# Patient Record
Sex: Male | Born: 1960 | Race: White | Hispanic: No | Marital: Single | Smoking: Never smoker
Health system: Southern US, Community
[De-identification: ages and names within clinical notes are randomized; demographics above are authoritative.]

## PROBLEM LIST (undated history)

## (undated) DIAGNOSIS — Z789 Other specified health status: Secondary | ICD-10-CM

## (undated) HISTORY — PX: LEG SURGERY: SHX1003

---

## 2004-01-25 ENCOUNTER — Emergency Department (HOSPITAL_COMMUNITY): Admission: EM | Admit: 2004-01-25 | Discharge: 2004-01-25 | Payer: Self-pay | Admitting: Emergency Medicine

## 2015-11-21 ENCOUNTER — Emergency Department (HOSPITAL_COMMUNITY)
Admission: EM | Admit: 2015-11-21 | Discharge: 2015-11-21 | Disposition: A | Discharge status: Home / Self Care | Payer: Self-pay | Attending: Emergency Medicine | Admitting: Emergency Medicine

## 2015-11-21 ENCOUNTER — Emergency Department (HOSPITAL_COMMUNITY): Payer: Self-pay

## 2015-11-21 ENCOUNTER — Encounter (HOSPITAL_COMMUNITY): Payer: Self-pay | Admitting: Family Medicine

## 2015-11-21 DIAGNOSIS — W010XXA Fall on same level from slipping, tripping and stumbling without subsequent striking against object, initial encounter: Secondary | ICD-10-CM | POA: Insufficient documentation

## 2015-11-21 DIAGNOSIS — Y999 Unspecified external cause status: Secondary | ICD-10-CM | POA: Insufficient documentation

## 2015-11-21 DIAGNOSIS — S63253A Unspecified dislocation of left middle finger, initial encounter: Secondary | ICD-10-CM | POA: Insufficient documentation

## 2015-11-21 DIAGNOSIS — S63251A Unspecified dislocation of left index finger, initial encounter: Secondary | ICD-10-CM | POA: Insufficient documentation

## 2015-11-21 DIAGNOSIS — S63259A Unspecified dislocation of unspecified finger, initial encounter: Secondary | ICD-10-CM

## 2015-11-21 DIAGNOSIS — Y9389 Activity, other specified: Secondary | ICD-10-CM | POA: Insufficient documentation

## 2015-11-21 DIAGNOSIS — Y929 Unspecified place or not applicable: Secondary | ICD-10-CM | POA: Insufficient documentation

## 2015-11-21 DIAGNOSIS — Z79891 Long term (current) use of opiate analgesic: Secondary | ICD-10-CM | POA: Insufficient documentation

## 2015-11-21 MED ORDER — BUPIVACAINE HCL (PF) 0.5 % IJ SOLN
20.0000 mL | Freq: Once | INTRAMUSCULAR | Status: AC
  Administered 2015-11-21: 20 mL
  Filled 2015-11-21: qty 20

## 2015-11-21 MED ORDER — HYDROCODONE-ACETAMINOPHEN 5-325 MG PO TABS: 1.0000 | ORAL_TABLET | ORAL | Status: DC | PRN

## 2015-11-21 NOTE — ED Notes (Signed)
Pt here for deformity to right middle and index finger. sts he was washing the truck and he put his left hand out. Denies any other injury. sts little pain.

## 2015-11-21 NOTE — ED Provider Notes (Signed)
CSN: [id number]     Arrival date & time 11/21/15  1535 History   First MD Initiated Contact with Patient 11/21/15 1559     Chief Complaint  Patient presents with  . Finger Injury  PT SAID THAT HE FELL PTA.  PT WAS WASHING HIS TRUCK IN FLIP FLOPS AND SLIPPED AND FELL.  HE SUSTAINED INJURIES TO HIS LEFT 3RD AND 2ND FINGERS WITH OBVIOUS DEFORMITIES.  PT SAID THAT HE DID HIT THE LEFT SIDE OF HIS HEAD, BUT NO LOC.  NO BLOOD THINNERS.  HE ALSO HIT HIS LEFT HIP, BUT HE CAN WALK.    (Consider location/radiation/quality/duration/timing/severity/associated sxs/prior Treatment) The history is provided by the patient and the spouse.    History reviewed. No pertinent past medical history. History reviewed. No pertinent past surgical history. History reviewed. No pertinent family history. Social History  Substance Use Topics  . Smoking status: Never Smoker   . Smokeless tobacco: None  . Alcohol Use: None    Review of Systems  Musculoskeletal:       LEFT 2ND AND 3RD FINGER PAIN  All other systems reviewed and are negative.     Allergies  Review of patient's allergies indicates no known allergies.  Home Medications   Prior to Admission medications   Medication Sig Start Date End Date Taking? Authorizing Provider  HYDROcodone-acetaminophen (NORCO/VICODIN) 5-325 MG tablet Take 1 tablet by mouth every 4 (four) hours as needed. 11/21/15   Jacalyn Lefevre, MD   BP 135/86 mmHg  Pulse 90  Temp(Src) 99.1 F (37.3 C) (Oral)  Resp 16  Ht 6" (0.152 m)  Wt 200 lb (90.719 kg)  BMI 3926.55 kg/m2  SpO2 97% Physical Exam  Constitutional: He is oriented to person, place, and time. He appears well-developed and well-nourished.  HENT:  Head: Normocephalic and atraumatic.  Right Ear: External ear normal.  Left Ear: External ear normal.  Nose: Nose normal.  Mouth/Throat: Oropharynx is clear and moist.  Eyes: Conjunctivae and EOM are normal. Pupils are equal, round, and reactive to light.  Neck:  Normal range of motion. Neck supple.  Cardiovascular: Normal rate, regular rhythm, normal heart sounds and intact distal pulses.   Pulmonary/Chest: Effort normal and breath sounds normal.  Abdominal: Soft. Bowel sounds are normal.  Musculoskeletal:  CLOSED DISLOCATION OF LEFT 2ND AND 3RD FINGERS AT PIP.   Neurological: He is alert and oriented to person, place, and time.  Skin: Skin is warm and dry.  Psychiatric: He has a normal mood and affect. His behavior is normal. Judgment and thought content normal.  Nursing note and vitals reviewed.   ED Course  .Nerve Block Date/Time: 11/21/2015 4:34 PM Performed by: Jacalyn Lefevre Authorized by: Jacalyn Lefevre Consent: Verbal consent obtained. Risks and benefits: risks, benefits and alternatives were discussed Consent given by: patient Patient understanding: patient states understanding of the procedure being performed Patient consent: the patient's understanding of the procedure matches consent given Procedure consent: procedure consent matches procedure scheduled Relevant documents: relevant documents present and verified Test results: test results available and properly labeled Site marked: the operative site was marked Required items: required blood products, implants, devices, and special equipment available Patient identity confirmed: verbally with patient Time out: Immediately prior to procedure a "time out" was called to verify the correct patient, procedure, equipment, support staff and site/side marked as required. Indications: pain relief and dislocation Body area: upper extremity Nerve: digital Laterality: left Patient sedated: no Preparation: Patient was prepped and draped in the usual sterile fashion. Patient  position: sitting Needle gauge: 27 G Local anesthetic: bupivacaine 0.5% without epinephrine Anesthetic total: 4 ml Outcome: pain improved Patient tolerance: Patient tolerated the procedure well with no immediate  complications Comments: BOTH THE LEFT 2ND AND 3RD FINGERS WERE DIGITALLY BLOCKED.  Reduction of dislocation Date/Time: 11/21/2015 4:35 PM Performed by: Jacalyn LefevreHAVILAND, Nerine Pulse Authorized by: Jacalyn LefevreHAVILAND, Arlicia Paquette Consent: Verbal consent obtained. Risks and benefits: risks, benefits and alternatives were discussed Consent given by: patient Patient understanding: patient states understanding of the procedure being performed Patient consent: the patient's understanding of the procedure matches consent given Procedure consent: procedure consent matches procedure scheduled Relevant documents: relevant documents present and verified Test results: test results available and properly labeled Site marked: the operative site was marked Required items: required blood products, implants, devices, and special equipment available Patient identity confirmed: verbally with patient Time out: Immediately prior to procedure a "time out" was called to verify the correct patient, procedure, equipment, support staff and site/side marked as required. Preparation: Patient was prepped and draped in the usual sterile fashion. Local anesthesia used: yes Anesthesia: digital block Local anesthetic: bupivacaine 0.5% without epinephrine Anesthetic total: 4 ml Patient sedated: no Patient tolerance: Patient tolerated the procedure well with no immediate complications Comments: LEFT 2ND AND 3RD FINGERS WERE REDUCED AT THE PIP JOINTS   (including critical care time) Labs Review Labs Reviewed - No data to display  Imaging Review Dg Hand Complete Left  11/21/2015  CLINICAL DATA:  Pt c/o generalized left hand pain and swelling after falling off his truck while washing it this afternoon. Pt states his middle and ring fingers were dislocated; images are taken post reduction. EXAM: LEFT HAND - COMPLETE 3+ VIEW COMPARISON:  None. FINDINGS: No fracture or dislocation. Mild scattered degenerative change. Mild chronic appearing deformity  fifth proximal phalanx with possible 4 mm exostosis. IMPRESSION: No acute findings Electronically Signed   By: Esperanza Heiraymond  Rubner M.D.   On: 11/21/2015 16:44  XRAY WAS POST REDUCTION I have personally reviewed and evaluated these images and lab results as part of my medical decision-making.   EKG Interpretation None     MDM  PT WILL BE PLACED IN A SPLINT AND INSTRUCTED TO F/U WITH HAND (DR. Janee MornHOMPSON).  RETURN HERE IF WORSE.  NO FURTHER IMAGING OF HEAD OR HIP NEEDED AS PT IS AMBULATORY AND HAS NO H/A, DIZZINESS, N/V AND IS NOT ON BLOOD THINNERS. Final diagnoses:  Finger dislocation, initial encounter       Jacalyn LefevreJulie Tadan Shill, MD 11/21/15 1656

## 2015-11-21 NOTE — Discharge Instructions (Signed)
Finger Dislocation ° A finger dislocation happens when your finger bones separate from where they connect with each other. It usually happens to the joint closest to your knuckle (proximal interphalangeal joint). °Your doctor will put your bones back in the joint. This may be done by pulling on your finger or through surgery. A bandage (dressing) or splint will be placed around your joint. The bandage or splint holds your finger in place while it heals. °HOME CARE  °· Rest your injured joint. Do not move it until told to do so. °· Put ice on your injured joint as told by your doctor. °¨ Put ice in a plastic bag. °¨ Place a towel between your skin and the bag. °¨ Leave the ice on for 15-20 minutes at a time. Do this every 2 hours while you are awake. °· Raise (elevate) your hand above your heart as told by your doctor. °· Only take medicines as told by your doctor. °GET HELP RIGHT AWAY IF: °· Your bandage or splint becomes damaged. °· Your pain becomes worse, not better. °· You lose feeling in your finger or it becomes cold or white. °MAKE SURE YOU: °· Understand these instructions. °· Will watch your condition. °· Will get help right away if you are not doing well or get worse. °  °This information is not intended to replace advice given to you by your health care provider. Make sure you discuss any questions you have with your health care provider. °  °Document Released: 05/18/2011 Document Revised: 06/19/2014 Document Reviewed: 10/23/2014 °Elsevier Interactive Patient Education ©2016 [employer] ° °

## 2017-06-12 IMAGING — CR DG HAND COMPLETE 3+V*L*
3 series · 3 of 3 positions shown · non-contrast
Comparison: None.

CLINICAL DATA: Pt c/o generalized left hand pain and swelling after
falling off his truck while washing it this afternoon. Pt states his
middle and ring fingers were dislocated; images are taken post
reduction.

EXAM:
LEFT HAND - COMPLETE 3+ VIEW

[hand pa]
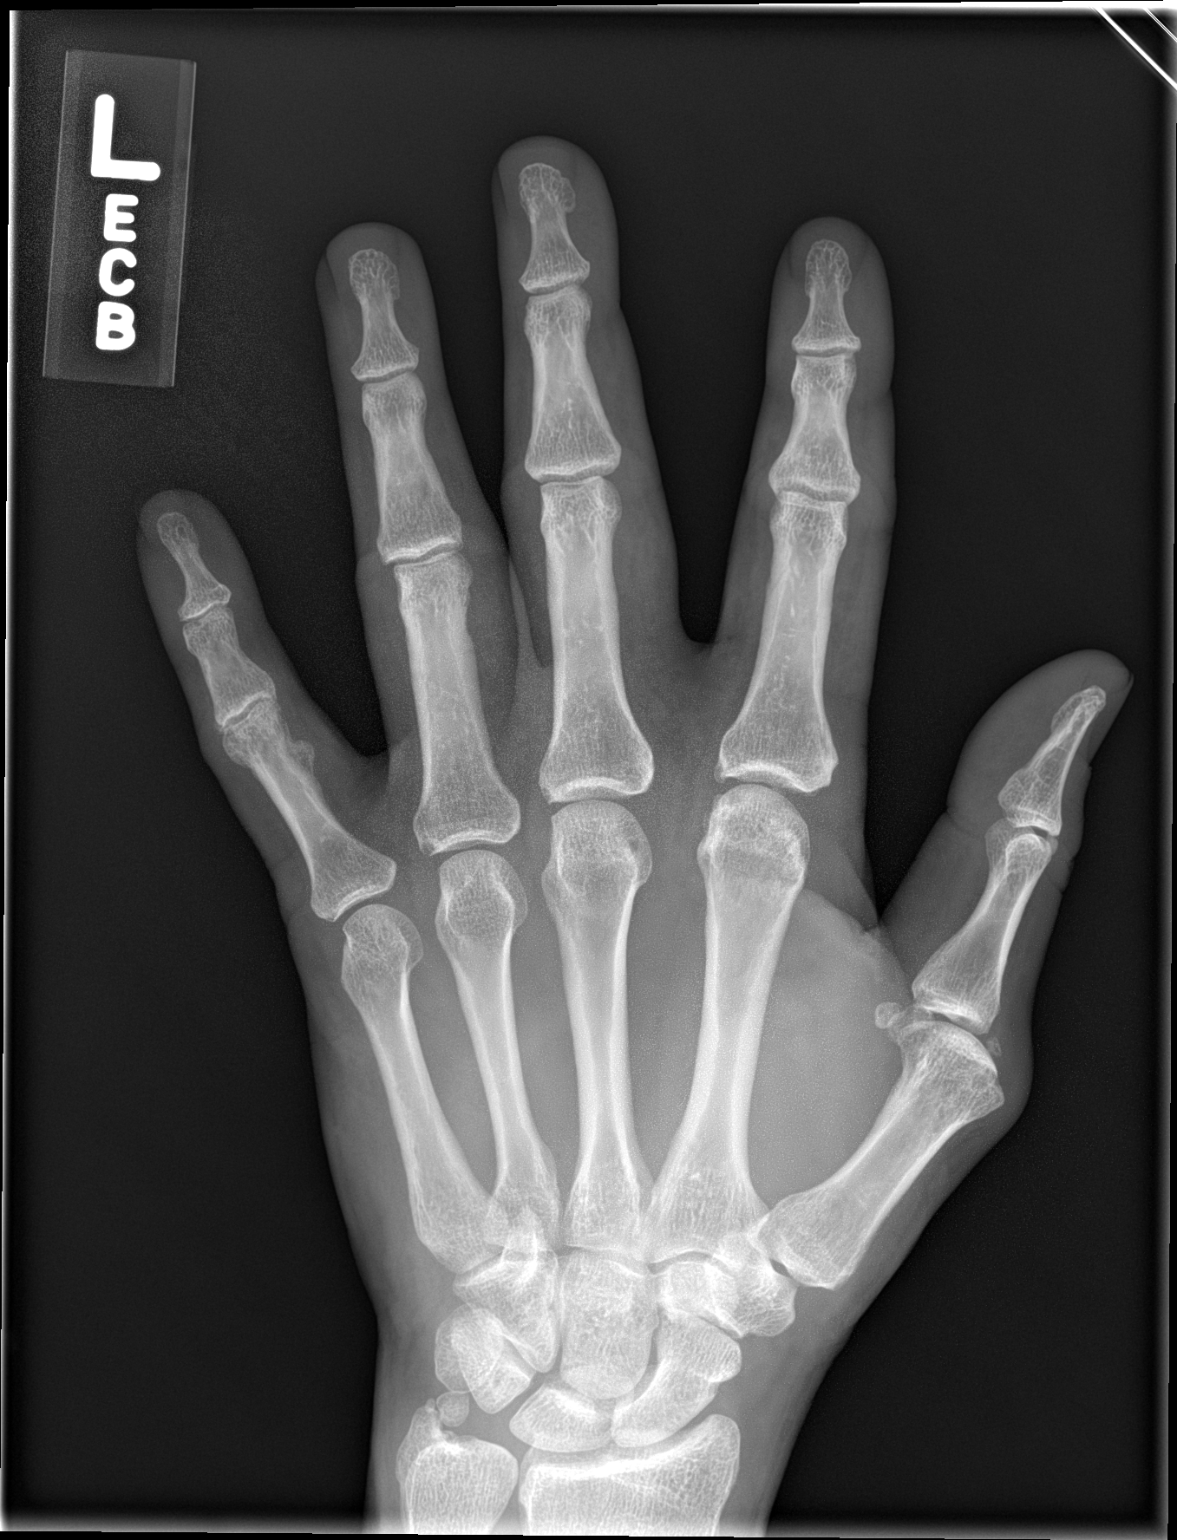

[hand obl]
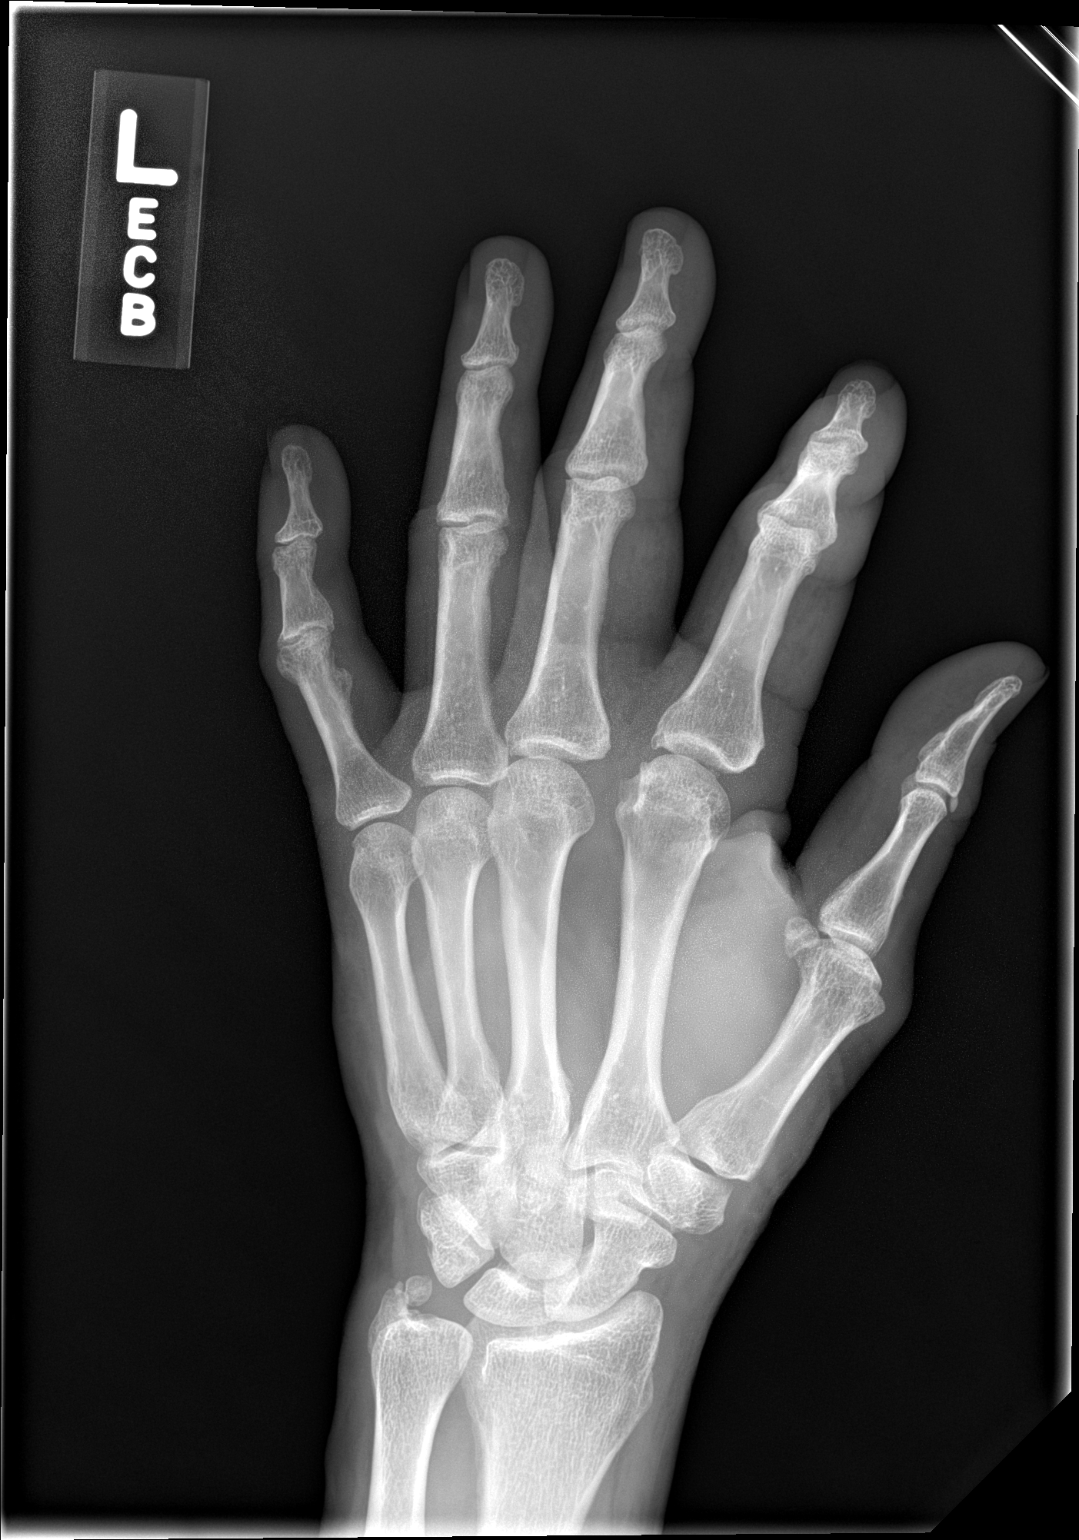

[hand lat]
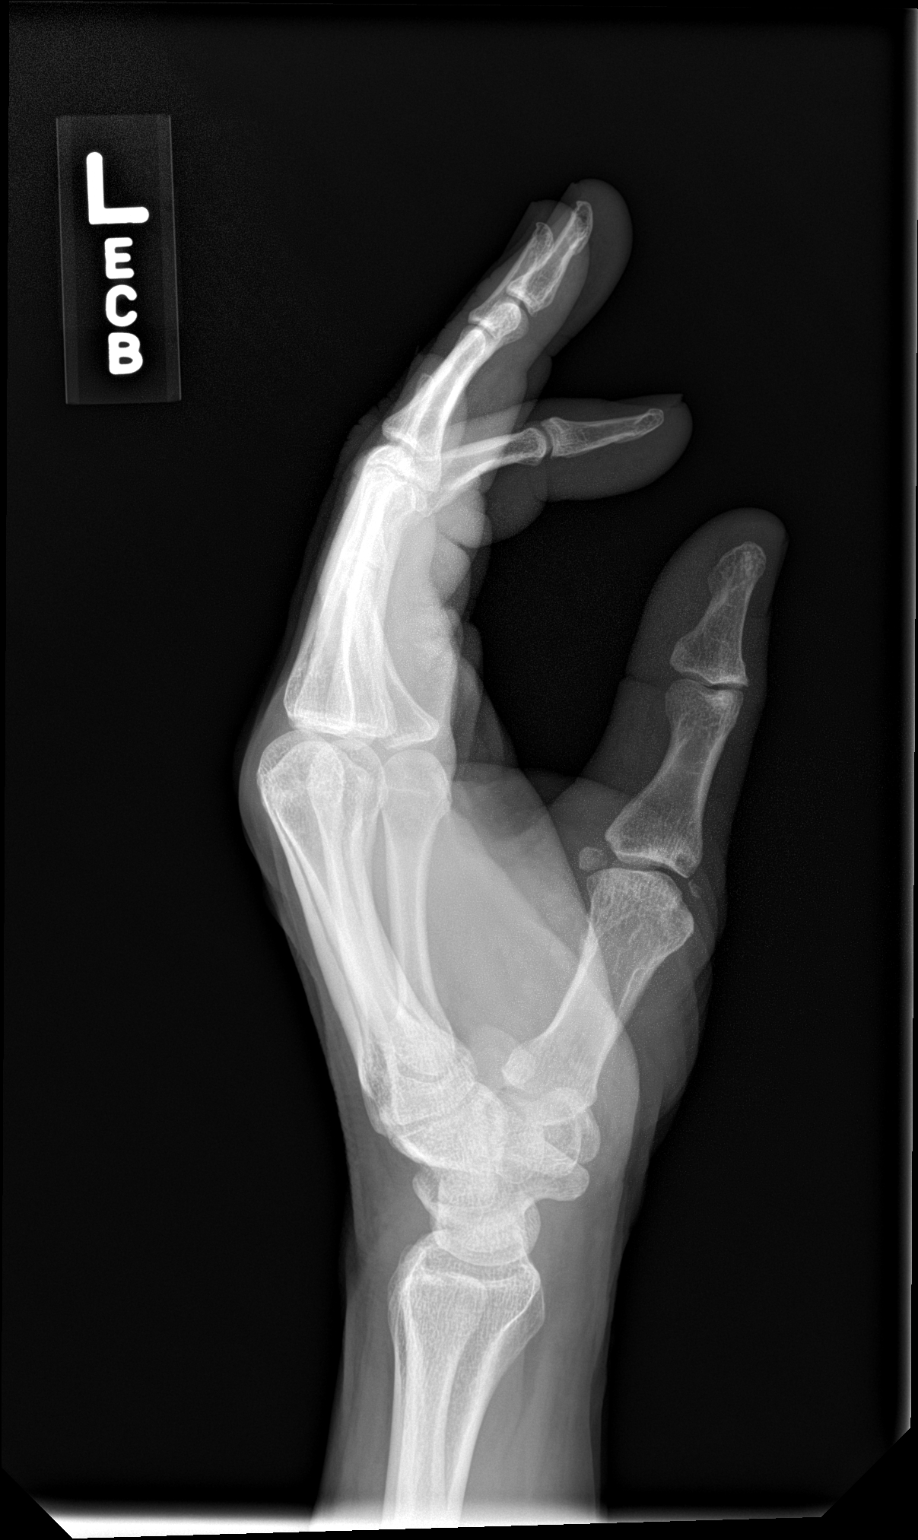

[3 of 3 positions shown; findings below may reference images not displayed]

FINDINGS: No fracture or dislocation. Mild scattered degenerative change. Mild
chronic appearing deformity fifth proximal phalanx with possible 4
mm exostosis.
IMPRESSION: No acute findings

## 2017-10-05 ENCOUNTER — Encounter (HOSPITAL_COMMUNITY): Payer: Self-pay | Admitting: *Deleted

## 2017-10-05 ENCOUNTER — Emergency Department (HOSPITAL_COMMUNITY)
Admission: EM | Admit: 2017-10-05 | Discharge: 2017-10-05 | Disposition: A | Discharge status: Home / Self Care | Payer: Self-pay | Attending: Emergency Medicine | Admitting: Emergency Medicine

## 2017-10-05 ENCOUNTER — Emergency Department (HOSPITAL_COMMUNITY): Payer: Self-pay

## 2017-10-05 DIAGNOSIS — Y93H2 Activity, gardening and landscaping: Secondary | ICD-10-CM | POA: Insufficient documentation

## 2017-10-05 DIAGNOSIS — Y999 Unspecified external cause status: Secondary | ICD-10-CM | POA: Insufficient documentation

## 2017-10-05 DIAGNOSIS — Y929 Unspecified place or not applicable: Secondary | ICD-10-CM | POA: Insufficient documentation

## 2017-10-05 DIAGNOSIS — W1830XA Fall on same level, unspecified, initial encounter: Secondary | ICD-10-CM | POA: Insufficient documentation

## 2017-10-05 DIAGNOSIS — S86811A Strain of other muscle(s) and tendon(s) at lower leg level, right leg, initial encounter: Secondary | ICD-10-CM | POA: Insufficient documentation

## 2017-10-05 MED ORDER — NAPROXEN 500 MG PO TABS: 500.0000 mg | ORAL_TABLET | Freq: Two times a day (BID) | ORAL | 0 refills | Status: DC

## 2017-10-05 NOTE — ED Triage Notes (Signed)
Pt complains of right knee pain since tripping over a concrete block 2 days ago. Pt states he can't bend his knee.

## 2017-10-05 NOTE — ED Provider Notes (Signed)
Patient unable to fully extend right knee.  Complains of right knee pain on exam no distress.  Right lower extremity skin intact.  Not red or warm.  He is swollen and tender over the anterior right knee.  He is unable to fully extend the knee. X-ray viewed by me   Doug SouJacubowitz, Shelley Cocke, MD 10/05/17 2117

## 2017-10-05 NOTE — Discharge Instructions (Addendum)
Call Dr. Woodward KuHaddox's office Monday morning and tell them you were evaluated in the ED and we spoke with him and you need a follow up appointment for a ruptured patellar tendon.

## 2017-10-05 NOTE — ED Provider Notes (Signed)
[facility] Provider Note   CSN: [id number] Arrival date & time: 10/05/17  1645     History   Chief Complaint Chief Complaint  Patient presents with  . Knee Injury    HPI Shawn MorrowBobby D Weinmann is a 57 y.o. male who presents to the ED with right knee pain. Patient reports that he was weed eating and the pain started after tripping over a concrete block. Patient states he fell and twisted his knee. The injury happened 2 days ago. Patient states that he thought it would get better with time and resting the area, however, he reports he is still unable to bend his knee.   HPI  History reviewed. No pertinent past medical history.  There are no active problems to display for this patient.   History reviewed. No pertinent surgical history.      Home Medications    Prior to Admission medications   Medication Sig Start Date End Date Taking? Authorizing Provider  naproxen (NAPROSYN) 500 MG tablet Take 1 tablet (500 mg total) by mouth 2 (two) times daily. 10/05/17   Janne NapoleonNeese, Nykeria Mealing M, NP    Family History No family history on file.  Social History Social History   Tobacco Use  . Smoking status: Never Smoker  Substance Use Topics  . Alcohol use: Not on file  . Drug use: Not on file     Allergies   Patient has no known allergies.   Review of Systems Review of Systems  Musculoskeletal: Positive for arthralgias and joint swelling.       Right knee swelling and decreased range of motion.  All other systems reviewed and are negative.    Physical Exam Updated Vital Signs BP (!) 145/85 (BP Location: Left Arm)   Pulse 85   Temp 98.2 F (36.8 C) (Oral)   Resp 16   SpO2 100%   Physical Exam  Constitutional: He appears well-developed and well-nourished. No distress.  HENT:  Head: Normocephalic.  Eyes: EOM are normal.  Neck: Neck supple.  Cardiovascular: Normal rate.  Pulmonary/Chest: Effort normal.  Musculoskeletal:       Right knee:  He exhibits decreased range of motion, swelling, abnormal alignment and abnormal patellar mobility. Tenderness found. Patellar tendon tenderness noted.  Pedal pulse 2+  Neurological: He is alert.  Skin: Skin is warm and dry.  Psychiatric: He has a normal mood and affect.  Nursing note and vitals reviewed.    ED Treatments / Results  Labs (all labs ordered are listed, but only abnormal results are displayed) Labs Reviewed - No data to display  Radiology Dg Knee Complete 4 Views Right  Result Date: 10/05/2017 CLINICAL DATA:  Fall with severe pain and swelling EXAM: RIGHT KNEE - COMPLETE 4+ VIEW COMPARISON:  None. FINDINGS: No acute displaced fracture is seen. Mild degenerative changes of the medial compartment. Patella Alta with significant soft tissue swelling of the infrapatellar soft tissues. IMPRESSION: 1. No acute fracture seen 2. Patella Alta with significant soft tissue thickening in the region of the infrapatellar soft tissues; if patellar tendon rupture or injury is suspected clinically, further evaluation with MRI could be obtained. Electronically Signed   By: Jasmine PangKim  Fujinaga M.D.   On: 10/05/2017 17:52   Dr. Ethelda ChickJacubowitz in to examine the patient and agrees with a/p.  Consult with Dr. Jena GaussHaddix and he will see the patient for f/u in the off and discuss surgery.   Procedures Procedures (including critical care time)  Medications Ordered in ED  Medications - No data to display   Initial Impression / Assessment and Plan / ED Course  I have reviewed the triage vital signs and the nursing notes. 57 y.o. male with limited range of motion of the right knee s/p injury stable for d/c to follow up with orthopedic surgeon. Most likely patellar tendon rupture. Knee immobilizer applied, crutches, ice, elevation, NSAIDS. No signs of compartment syndrome at this time. Discussed with the patient clinical and x-ray findings and consult with ortho. Patient agrees with plan. Patient to return for any  problems.   Final Clinical Impressions(s) / ED Diagnoses   Final diagnoses:  Rupture of right patellar tendon, initial encounter    ED Discharge Orders        Ordered    naproxen (NAPROSYN) 500 MG tablet  2 times daily     10/05/17 2108       Kerrie [place], [place] 10/05/17 2147    Doug Sou, MD 10/06/17 [phone]

## 2017-10-09 ENCOUNTER — Ambulatory Visit: Payer: Self-pay | Admitting: Student

## 2017-10-09 DIAGNOSIS — S86811A Strain of other muscle(s) and tendon(s) at lower leg level, right leg, initial encounter: Secondary | ICD-10-CM

## 2017-10-10 ENCOUNTER — Encounter (HOSPITAL_COMMUNITY): Payer: Self-pay | Admitting: *Deleted

## 2017-10-10 ENCOUNTER — Other Ambulatory Visit: Payer: Self-pay

## 2017-10-10 NOTE — Progress Notes (Signed)
Spoke with pt for pre-op call. Pt denies cardiac history, HTN or diabetes.  

## 2017-10-12 ENCOUNTER — Ambulatory Visit (HOSPITAL_COMMUNITY): Payer: Self-pay | Admitting: Certified Registered"

## 2017-10-12 ENCOUNTER — Encounter (HOSPITAL_COMMUNITY)
Admission: RE | Disposition: A | Discharge status: Home / Self Care | Payer: Self-pay | Source: Ambulatory Visit | Attending: Student

## 2017-10-12 ENCOUNTER — Ambulatory Visit (HOSPITAL_COMMUNITY)
Admission: RE | Admit: 2017-10-12 | Discharge: 2017-10-12 | Disposition: A | Discharge status: Home / Self Care | Payer: Self-pay | Source: Ambulatory Visit | Attending: Student | Admitting: Student

## 2017-10-12 ENCOUNTER — Encounter (HOSPITAL_COMMUNITY): Payer: Self-pay | Admitting: Certified Registered"

## 2017-10-12 DIAGNOSIS — Y9389 Activity, other specified: Secondary | ICD-10-CM | POA: Insufficient documentation

## 2017-10-12 DIAGNOSIS — W010XXA Fall on same level from slipping, tripping and stumbling without subsequent striking against object, initial encounter: Secondary | ICD-10-CM | POA: Insufficient documentation

## 2017-10-12 DIAGNOSIS — Z8249 Family history of ischemic heart disease and other diseases of the circulatory system: Secondary | ICD-10-CM | POA: Insufficient documentation

## 2017-10-12 DIAGNOSIS — S86811A Strain of other muscle(s) and tendon(s) at lower leg level, right leg, initial encounter: Secondary | ICD-10-CM | POA: Insufficient documentation

## 2017-10-12 DIAGNOSIS — Z79899 Other long term (current) drug therapy: Secondary | ICD-10-CM | POA: Insufficient documentation

## 2017-10-12 DIAGNOSIS — Z880 Allergy status to penicillin: Secondary | ICD-10-CM | POA: Insufficient documentation

## 2017-10-12 DIAGNOSIS — S76111A Strain of right quadriceps muscle, fascia and tendon, initial encounter: Secondary | ICD-10-CM | POA: Insufficient documentation

## 2017-10-12 DIAGNOSIS — Y92096 Garden or yard of other non-institutional residence as the place of occurrence of the external cause: Secondary | ICD-10-CM | POA: Insufficient documentation

## 2017-10-12 HISTORY — DX: Other specified health status: Z78.9

## 2017-10-12 HISTORY — PX: PATELLAR TENDON REPAIR: SHX737

## 2017-10-12 LAB — CBC
HEMATOCRIT: 40.2 % (ref 39.0–52.0)
Hemoglobin: 13.9 g/dL (ref 13.0–17.0)
MCH: 32.1 pg (ref 26.0–34.0)
MCHC: 34.6 g/dL (ref 30.0–36.0)
MCV: 92.8 fL (ref 78.0–100.0)
Platelets: 171 10*3/uL (ref 150–400)
RBC: 4.33 MIL/uL (ref 4.22–5.81)
RDW: 12.2 % (ref 11.5–15.5)
WBC: 4.4 10*3/uL (ref 4.0–10.5)

## 2017-10-12 SURGERY — REPAIR, TENDON, PATELLAR
Anesthesia: General | Laterality: Right

## 2017-10-12 MED ORDER — LACTATED RINGERS IV SOLN
Freq: Once | INTRAVENOUS | Status: AC
  Administered 2017-10-12: 07:00:00 via INTRAVENOUS

## 2017-10-12 MED ORDER — HYDROMORPHONE HCL 2 MG/ML IJ SOLN
INTRAMUSCULAR | Status: AC
  Administered 2017-10-12: 0.5 mg via INTRAVENOUS
  Filled 2017-10-12: qty 1

## 2017-10-12 MED ORDER — PROPOFOL 10 MG/ML IV BOLUS
INTRAVENOUS | Status: DC | PRN
  Administered 2017-10-12: 100 mg via INTRAVENOUS
  Administered 2017-10-12: 200 mg via INTRAVENOUS

## 2017-10-12 MED ORDER — ASPIRIN EC 325 MG PO TBEC: 325.0000 mg | DELAYED_RELEASE_TABLET | Freq: Every day | ORAL | 0 refills | Status: AC

## 2017-10-12 MED ORDER — PROPOFOL 10 MG/ML IV BOLUS
INTRAVENOUS | Status: AC
  Filled 2017-10-12: qty 20

## 2017-10-12 MED ORDER — DEXAMETHASONE SODIUM PHOSPHATE 10 MG/ML IJ SOLN
INTRAMUSCULAR | Status: DC | PRN
  Administered 2017-10-12: 10 mg via INTRAVENOUS

## 2017-10-12 MED ORDER — PROPOFOL 10 MG/ML IV BOLUS
INTRAVENOUS | Status: AC
Start: 2017-10-12 — End: ?
  Filled 2017-10-12: qty 20

## 2017-10-12 MED ORDER — HYDROCODONE-ACETAMINOPHEN 5-325 MG PO TABS: 1.0000 | ORAL_TABLET | Freq: Four times a day (QID) | ORAL | 0 refills | Status: AC | PRN

## 2017-10-12 MED ORDER — MIDAZOLAM HCL 5 MG/5ML IJ SOLN
INTRAMUSCULAR | Status: DC | PRN
  Administered 2017-10-12: 2 mg via INTRAVENOUS

## 2017-10-12 MED ORDER — LIDOCAINE 2% (20 MG/ML) 5 ML SYRINGE
INTRAMUSCULAR | Status: DC | PRN
  Administered 2017-10-12: 100 mg via INTRAVENOUS

## 2017-10-12 MED ORDER — PROMETHAZINE HCL 25 MG/ML IJ SOLN
6.2500 mg | INTRAMUSCULAR | Status: DC | PRN
Start: 2017-10-12 — End: 2017-10-12

## 2017-10-12 MED ORDER — KETOROLAC TROMETHAMINE 30 MG/ML IJ SOLN
INTRAMUSCULAR | Status: AC
  Filled 2017-10-12: qty 1

## 2017-10-12 MED ORDER — VANCOMYCIN HCL 1000 MG IV SOLR
INTRAVENOUS | Status: AC
  Filled 2017-10-12: qty 1000

## 2017-10-12 MED ORDER — BUPIVACAINE HCL (PF) 0.25 % IJ SOLN
INTRAMUSCULAR | Status: DC | PRN
  Administered 2017-10-12: 20 mL

## 2017-10-12 MED ORDER — VANCOMYCIN HCL 1000 MG IV SOLR
INTRAVENOUS | Status: DC | PRN
  Administered 2017-10-12: 1000 mg via TOPICAL

## 2017-10-12 MED ORDER — LACTATED RINGERS IV SOLN
INTRAVENOUS | Status: DC | PRN
  Administered 2017-10-12: 08:00:00 via INTRAVENOUS

## 2017-10-12 MED ORDER — KETOROLAC TROMETHAMINE 30 MG/ML IJ SOLN
30.0000 mg | Freq: Once | INTRAMUSCULAR | Status: AC | PRN
  Administered 2017-10-12: 30 mg via INTRAVENOUS

## 2017-10-12 MED ORDER — MIDAZOLAM HCL 2 MG/2ML IJ SOLN
INTRAMUSCULAR | Status: AC
  Filled 2017-10-12: qty 2

## 2017-10-12 MED ORDER — BUPIVACAINE HCL (PF) 0.5 % IJ SOLN
INTRAMUSCULAR | Status: AC
  Filled 2017-10-12: qty 30

## 2017-10-12 MED ORDER — CEFAZOLIN SODIUM-DEXTROSE 2-4 GM/100ML-% IV SOLN
2.0000 g | INTRAVENOUS | Status: AC
  Administered 2017-10-12: 2 g via INTRAVENOUS

## 2017-10-12 MED ORDER — 0.9 % SODIUM CHLORIDE (POUR BTL) OPTIME
TOPICAL | Status: DC | PRN
  Administered 2017-10-12: 1000 mL

## 2017-10-12 MED ORDER — LIDOCAINE 2% (20 MG/ML) 5 ML SYRINGE
INTRAMUSCULAR | Status: AC
  Filled 2017-10-12: qty 5

## 2017-10-12 MED ORDER — ONDANSETRON HCL 4 MG/2ML IJ SOLN
INTRAMUSCULAR | Status: DC | PRN
  Administered 2017-10-12: 4 mg via INTRAVENOUS

## 2017-10-12 MED ORDER — CHLORHEXIDINE GLUCONATE 4 % EX LIQD: 60.0000 mL | Freq: Once | CUTANEOUS | Status: DC

## 2017-10-12 MED ORDER — HYDROMORPHONE HCL 2 MG/ML IJ SOLN
0.2500 mg | INTRAMUSCULAR | Status: DC | PRN
  Administered 2017-10-12 (×2): 0.5 mg via INTRAVENOUS

## 2017-10-12 MED ORDER — FENTANYL CITRATE (PF) 250 MCG/5ML IJ SOLN
INTRAMUSCULAR | Status: AC
  Filled 2017-10-12: qty 5

## 2017-10-12 MED ORDER — FENTANYL CITRATE (PF) 100 MCG/2ML IJ SOLN
INTRAMUSCULAR | Status: DC | PRN
  Administered 2017-10-12 (×2): 25 ug via INTRAVENOUS
  Administered 2017-10-12: 50 ug via INTRAVENOUS

## 2017-10-12 SURGICAL SUPPLY — 64 items
ADH SKN CLS APL DERMABOND .7 (GAUZE/BANDAGES/DRESSINGS) ×1
BANDAGE ACE 4X5 VEL STRL LF (GAUZE/BANDAGES/DRESSINGS) ×3 IMPLANT
BANDAGE ACE 6X5 VEL STRL LF (GAUZE/BANDAGES/DRESSINGS) ×3 IMPLANT
BIT DRILL 2.4 (BIT) ×1
BIT DRILL QC 2.4 MINI 80 (BIT) IMPLANT
BLADE CLIPPER SURG (BLADE) ×3 IMPLANT
BLADE SURG 10 STRL SS (BLADE) IMPLANT
BNDG GAUZE ELAST 4 BULKY (GAUZE/BANDAGES/DRESSINGS) ×6 IMPLANT
BRUSH SCRUB SURG 4.25 DISP (MISCELLANEOUS) ×6 IMPLANT
COVER SURGICAL LIGHT HANDLE (MISCELLANEOUS) ×6 IMPLANT
CUFF TOURNIQUET SINGLE 34IN LL (TOURNIQUET CUFF) IMPLANT
CUFF TOURNIQUET SINGLE 44IN (TOURNIQUET CUFF) IMPLANT
DECANTER SPIKE VIAL GLASS SM (MISCELLANEOUS) IMPLANT
DERMABOND ADVANCED (GAUZE/BANDAGES/DRESSINGS) ×2
DERMABOND ADVANCED .7 DNX12 (GAUZE/BANDAGES/DRESSINGS) IMPLANT
DRAPE C-ARM 42X72 X-RAY (DRAPES) ×3 IMPLANT
DRAPE C-ARMOR (DRAPES) ×3 IMPLANT
DRILL BIT 2.4MM (BIT) ×3
DRSG ADAPTIC 3X8 NADH LF (GAUZE/BANDAGES/DRESSINGS) ×3 IMPLANT
DRSG EMULSION OIL 3X3 NADH (GAUZE/BANDAGES/DRESSINGS) ×3 IMPLANT
DRSG PAD ABDOMINAL 8X10 ST (GAUZE/BANDAGES/DRESSINGS) ×3 IMPLANT
ELECT REM PT RETURN 9FT ADLT (ELECTROSURGICAL) ×3
ELECTRODE REM PT RTRN 9FT ADLT (ELECTROSURGICAL) ×1 IMPLANT
GAUZE SPONGE 4X4 12PLY STRL (GAUZE/BANDAGES/DRESSINGS) ×2 IMPLANT
GLOVE BIO SURGEON STRL SZ7.5 (GLOVE) ×3 IMPLANT
GLOVE BIO SURGEON STRL SZ8 (GLOVE) ×3 IMPLANT
GLOVE BIOGEL PI IND STRL 7.5 (GLOVE) ×1 IMPLANT
GLOVE BIOGEL PI IND STRL 8 (GLOVE) ×1 IMPLANT
GLOVE BIOGEL PI INDICATOR 7.5 (GLOVE) ×2
GLOVE BIOGEL PI INDICATOR 8 (GLOVE) ×2
GOWN STRL REUS W/ TWL XL LVL3 (GOWN DISPOSABLE) ×1 IMPLANT
GOWN STRL REUS W/TWL 2XL LVL3 (GOWN DISPOSABLE) ×6 IMPLANT
GOWN STRL REUS W/TWL XL LVL3 (GOWN DISPOSABLE) ×3
KIT BASIN OR (CUSTOM PROCEDURE TRAY) ×3 IMPLANT
KIT TURNOVER KIT B (KITS) ×3 IMPLANT
MANIFOLD NEPTUNE II (INSTRUMENTS) ×3 IMPLANT
NEEDLE 22X1 1/2 (OR ONLY) (NEEDLE) IMPLANT
NS IRRIG 1000ML POUR BTL (IV SOLUTION) ×3 IMPLANT
PACK ORTHO EXTREMITY (CUSTOM PROCEDURE TRAY) ×3 IMPLANT
PAD ARMBOARD 7.5X6 YLW CONV (MISCELLANEOUS) ×6 IMPLANT
PAD CAST 4YDX4 CTTN HI CHSV (CAST SUPPLIES) ×2 IMPLANT
PADDING CAST COTTON 4X4 STRL (CAST SUPPLIES) ×6
PADDING CAST COTTON 6X4 STRL (CAST SUPPLIES) ×2 IMPLANT
PASSER SUT SWANSON 36MM LOOP (INSTRUMENTS) IMPLANT
STAPLER VISISTAT 35W (STAPLE) ×3 IMPLANT
SUCTION FRAZIER HANDLE 10FR (MISCELLANEOUS) ×2
SUCTION TUBE FRAZIER 10FR DISP (MISCELLANEOUS) ×1 IMPLANT
SUT FIBERWIRE #2 38 T-5 BLUE (SUTURE)
SUT FIBERWIRE #5 38 CONV NDL (SUTURE)
SUT STEEL 5 V 56 M (SUTURE) ×3 IMPLANT
SUT VIC AB 0 CT1 27 (SUTURE) ×3
SUT VIC AB 0 CT1 27XBRD ANBCTR (SUTURE) ×1 IMPLANT
SUT VIC AB 2-0 CT1 27 (SUTURE) ×6
SUT VIC AB 2-0 CT1 TAPERPNT 27 (SUTURE) ×2 IMPLANT
SUTURE FIBERWR #2 38 T-5 BLUE (SUTURE) IMPLANT
SUTURE FIBERWR #5 38 CONV NDL (SUTURE) IMPLANT
SYR CONTROL 10ML LL (SYRINGE) IMPLANT
TOWEL OR 17X24 6PK STRL BLUE (TOWEL DISPOSABLE) ×3 IMPLANT
TOWEL OR 17X26 10 PK STRL BLUE (TOWEL DISPOSABLE) ×6 IMPLANT
TUBE CONNECTING 12'X1/4 (SUCTIONS) ×1
TUBE CONNECTING 12X1/4 (SUCTIONS) ×2 IMPLANT
UNDERPAD 30X30 (UNDERPADS AND DIAPERS) ×3 IMPLANT
WATER STERILE IRR 1000ML POUR (IV SOLUTION) ×3 IMPLANT
YANKAUER SUCT BULB TIP NO VENT (SUCTIONS) IMPLANT

## 2017-10-12 NOTE — Anesthesia Preprocedure Evaluation (Signed)
Anesthesia Evaluation  Patient identified by MRN, date of birth, ID band Patient awake    Reviewed: Allergy & Precautions, NPO status , Patient's Chart, lab work & pertinent test results  Airway Mallampati: II  TM Distance: >3 FB Neck ROM: Full    Dental no notable dental hx.    Pulmonary neg pulmonary ROS,    Pulmonary exam normal breath sounds clear to auscultation       Cardiovascular negative cardio ROS Normal cardiovascular exam Rhythm:Regular Rate:Normal     Neuro/Psych negative neurological ROS  negative psych ROS   GI/Hepatic negative GI ROS, Neg liver ROS,   Endo/Other  negative endocrine ROS  Renal/GU negative Renal ROS  negative genitourinary   Musculoskeletal negative musculoskeletal ROS (+)   Abdominal   Peds negative pediatric ROS (+)  Hematology negative hematology ROS (+)   Anesthesia Other Findings   Reproductive/Obstetrics negative OB ROS                             Anesthesia Physical Anesthesia Plan  ASA: I  Anesthesia Plan: General   Post-op Pain Management:    Induction: Intravenous  PONV Risk Score and Plan: 2 and Ondansetron, Dexamethasone and Treatment may vary due to age or medical condition  Airway Management Planned: LMA  Additional Equipment:   Intra-op Plan:   Post-operative Plan: Extubation in OR  Informed Consent: I have reviewed the patients History and Physical, chart, labs and discussed the procedure including the risks, benefits and alternatives for the proposed anesthesia with the patient or authorized representative who has indicated his/her understanding and acceptance.     Dental advisory given  Plan Discussed with: CRNA and Surgeon  Anesthesia Plan Comments:         Anesthesia Quick Evaluation  

## 2017-10-12 NOTE — Op Note (Signed)
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 He was found to have patellar tendon rupture. Due to his activity level and age, I felt that repair was indicated. I discussed risks and benefits with the patient. Risks discussed included bleeding requiring blood transfusion, bleeding causing a hematoma, infection, malunion, nonunion, damage to surrounding nerves and blood vessels, pain, hardware prominence or irritation, hardware failure, stiffness, post-traumatic arthritis, DVT/PE, compartment syndrome, and even death. The patient agreed to surgery and consent was obtained.  Operative Findings: Rupture of patellar tendon off of inferior border of patella treated with repair using #5 Fiberwire through drill holes in patella and retinacular repair using #1 vicryl. Knee with greater than 50 degrees of flexion without gapping at repair site.  Procedure: The patient was identified in the preoperative holding area. Consent was confirmed with the patient and their family and all questions were answered. The operative extremity was marked after confirmation with the patient. he was then brought back to the operating room by our anesthesia colleagues. He was carefully transferred over to regular OR table and he was placed under general anesthesia. The operative extremity was then prepped and draped in usual sterile fashion. A preoperative  timeout was performed to verify the patient, the procedure, and the extremity. Preoperative antibiotics were dosed.  An esmarch was used to exsanguinate the right leg and the tourniquet was inflated to . An anterior approach to the knee was made over the patella tendon rupture. The skin and subcutaneous tissue was incised with a knife and skin flaps were mobilized to fully visualize the retinacular tear both medially and laterally. The inferior pole of the patella was debrided with a ronguer to get back to bleeding, healthy cancellous bone for a better repair. The distal aspect of the patellar tendon was debrided to a level of healthy tendon. Two number 5 fiberwire sutures was used to throw Krackow suture through the patellar tendon. Leaving four strands. Three drill holes were made from proximal to distal to pass the strands through. The middle strands were passed beneath the quad tendon to tie to the medial and lateral strands. Kochers were used to provide tension to the patella tendon during tying of the suture strands.   A #1 vicryl suture was used to close the medial and lateral retinacular tear and reinforce the patellar tendon repair. The knee was then flexed and the repair remained strong and without significant tension to almost 60 degrees of flexion. The incision was irrigated and closed with 2-0 vicryl, 3-0 monocryl and dermabond. The tourniquet was deflated. The incision dressed with 4x4s and an ACE wrap and he was placed back in his knee immobilizer. The patient was then taken to the PACU in stable condition.  Post Op Plan/Instructions: Weight bearing as tolerated in knee immobilizer/hinged knee brace locked in extension. Return in 2 weeks for repeat x-rays. Aspirin  daily for VTE prophylaxis.  I was present and performed the entire surgery.  Truitt Merle, MD [facility]

## 2017-10-12 NOTE — Transfer of Care (Signed)
Immediate Anesthesia Transfer of Care Note  Patient: Shawn Thornton  Procedure(s) Performed: PATELLA TENDON REPAIR (Right )  Patient Location: PACU  Anesthesia Type:General  Level of Consciousness: awake, alert  and oriented  Airway & Oxygen Therapy: Patient Spontanous Breathing and Patient connected to nasal cannula oxygen  Post-op Assessment: Report given to RN and Post -op Vital signs reviewed and stable  Post vital signs: Reviewed and stable  Last Vitals:  Vitals Value Taken Time  BP    Temp    Pulse    Resp    SpO2      Last Pain:  Vitals:   10/12/17 0656  TempSrc:   PainSc: 0-No pain      Patients Stated Pain Goal: 3 (10/12/17 0656)  Complications: No apparent anesthesia complications

## 2017-10-12 NOTE — Anesthesia Procedure Notes (Addendum)
Procedure Name: LMA Insertion Date/Time: 10/12/2017 8:06 AM Performed by: Elliot Dally, CRNA Pre-anesthesia Checklist: Patient identified, Emergency Drugs available, Suction available and Patient being monitored Patient Re-evaluated:Patient Re-evaluated prior to induction Oxygen Delivery Method: Circle System Utilized Preoxygenation: Pre-oxygenation with 100% oxygen Induction Type: IV induction Ventilation: Mask ventilation without difficulty LMA: LMA inserted LMA Size: 5.0 Number of attempts: 1 Airway Equipment and Method: Bite block Placement Confirmation: positive ETCO2 Tube secured with: Tape Dental Injury: Teeth and Oropharynx as per pre-operative assessment

## 2017-10-12 NOTE — H&P (Signed)
[facility] ([facility]) H&P  Patient ID: KRISTOPH SATTLER MRN: [id number] DOB/AGE: May 03, 1961 57 y.o.  Reason for Surgery: Right patellar tendon rupture  HPI: GLENN GULLICKSON is an 57 y.o. male who was doing yardwork when he tripped over concrete and fell and twisted his knee. Patient was unable to bend or straighten his knee. He presented to ER where x-rays showed patella alta and he was diagnosed with patellar tendon rupture. He was placed in knee immobilizer and was told to follow up with me. Denies any significant medical problems.  Past Medical History:  Diagnosis Date  . Medical history non-contributory     Past Surgical History:  Procedure Laterality Date  . LEG SURGERY Right    traumatic injury with sutures    Family History  Problem Relation Age of Onset  . Anuerysm Mother     Social History:  reports that he has never smoked. He has never used smokeless tobacco. He reports that he does not drink alcohol or use drugs.  Allergies:  Allergies  Allergen Reactions  . Penicillins Rash and Other (See Comments)    PATIENT HAS HAD A PCN REACTION WITH IMMEDIATE RASH, FACIAL/TONGUE/THROAT SWELLING, SOB, OR LIGHTHEADEDNESS WITH HYPOTENSION:  #  #  YES  #  #  Has patient had a PCN reaction causing severe rash involving mucus membranes or skin necrosis: no Has patient had a PCN reaction that required hospitalization: no Has patient had a PCN reaction occurring within the last 10 years: no     Medications:  No current facility-administered medications on file prior to encounter.    Current Outpatient Medications on File Prior to Encounter  Medication Sig Dispense Refill  . Multiple Vitamins-Minerals (MULTIVITAMIN WITH MINERALS) tablet Take 1 tablet by mouth daily.    . naproxen (NAPROSYN) 500 MG tablet Take 1 tablet (500 mg total) by mouth 2 (two) times daily. (Patient not taking: Reported on 10/10/2017) 20 tablet 0    ROS: Constitutional: No fever or chills Vision:  No changes in vision ENT: No difficulty swallowing CV: No chest pain Pulm: No SOB or wheezing GI: No nausea or vomiting GU: No urgency or inability to hold urine Skin: No poor wound healing Neurologic: No numbness or tingling Psychiatric: No depression or anxiety Heme: No bruising Allergic: No reaction to medications or food   Exam: Blood pressure (!) 141/88, pulse 64, temperature 98.1 F (36.7 C), temperature source Oral, resp. rate 18, height 6' (1.829 m), weight 90.7 kg (200 lb), SpO2 97 %. General:NAD Orientation:AAOx3 Mood and Affect: Cooperative and pleasant  RLE: Skin without lesions, swelling and ecchymosis. Palpable defect below patella with high riding patella. Unable to extend knee. Compartments soft and compressible. Motor and sensory function intact to right leg.  LLE: Skin without lesions. No tenderness to palpation. Full painless ROM, full strength in each muscle groups without evidence of instability.   Medical Decision Making: Imaging: Right knee x-rays show patella alta consistent with patellar tendon rupture  Labs: CBC No results found for: WBC, RBC, HGB, HCT, PLT, MCV, MCH, MCHC, RDW, LYMPHSABS, MONOABS, EOSABS, BASOSABS  Medical history and chart was reviewed  Assessment/Plan: 57 year old male with patellar tendon rupture  Plan for repair of tendon. Risks and benefits discussed. Patient agrees to proceed with surgery and consent obtained. Plan to go home the same day.   Roby Lofts, MD [facility] [phone] (phone)

## 2017-10-12 NOTE — Progress Notes (Addendum)
Dr. Jena Gauss aware of pt's PCN allergy, wishes to proceed with Ancef. Reports doing "okay" with Amoxicillin in past. Pt reports PCN caused hives >30 years ago.

## 2017-10-12 NOTE — Anesthesia Postprocedure Evaluation (Signed)
Anesthesia Post Note  Patient: Shawn Thornton  Procedure(s) Performed: PATELLA TENDON REPAIR (Right )     Patient location during evaluation: PACU Anesthesia Type: General Level of consciousness: awake and alert Pain management: pain level controlled Vital Signs Assessment: post-procedure vital signs reviewed and stable Respiratory status: spontaneous breathing, nonlabored ventilation, respiratory function stable and patient connected to nasal cannula oxygen Cardiovascular status: blood pressure returned to baseline and stable Postop Assessment: no apparent nausea or vomiting Anesthetic complications: no    Last Vitals:  Vitals:   10/12/17 1037 10/12/17 1038  BP: (!) 142/89   Pulse: 70 72  Resp: 11 11  Temp:  36.7 C  SpO2: 100% 98%    Last Pain:  Vitals:   10/12/17 0945  TempSrc:   PainSc: 5         RLE Motor Response: Purposeful movement;Responds to commands (10/12/17 1038) RLE Sensation: Full sensation (10/12/17 1038)      [name] S

## 2017-10-12 NOTE — Discharge Instructions (Addendum)
[facility] Discharge Instructions   General Discharge Instructions  WEIGHT BEARING STATUS: Weight bearing as tolerated in knee brace  RANGE OF MOTION/ACTIVITY: Keep knee straight at all times  Wound Care: You may remove dressing Monday morning and may shower after the dressing comes off  DVT/PE prophylaxis: Daily aspirin 325 mg to prevent blood clots  Diet: as you were eating previously.  Can use over the counter stool softeners and bowel preparations, such as Miralax, to help with bowel movements.  Narcotics can be constipating.  Be sure to drink plenty of fluids  PAIN MEDICATION USE AND EXPECTATIONS  You have likely been given narcotic medications to help control your pain.  After a traumatic event that results in an fracture (broken bone) with or without surgery, it is ok to use narcotic pain medications to help control one's pain.  We understand that everyone responds to pain differently and each individual patient will be evaluated on a regular basis for the continued need for narcotic medications. Ideally, narcotic medication use should last no more than 6-8 weeks (coinciding with fracture healing).   As a patient it is your responsibility as well to monitor narcotic medication use and report the amount and frequency you use these medications when you come to your office visit.   We would also advise that if you are using narcotic medications, you should take a dose prior to therapy to maximize you participation.  IF YOU ARE ON NARCOTIC MEDICATIONS IT IS NOT PERMISSIBLE TO OPERATE A MOTOR VEHICLE (MOTORCYCLE/CAR/TRUCK/MOPED) OR HEAVY MACHINERY DO NOT MIX NARCOTICS WITH OTHER CNS (CENTRAL NERVOUS SYSTEM) DEPRESSANTS SUCH AS ALCOHOL   STOP SMOKING OR USING NICOTINE PRODUCTS!!!!  As discussed nicotine severely impairs your body's ability to heal surgical and traumatic wounds but also impairs bone healing.  Wounds and bone heal by forming microscopic blood vessels  (angiogenesis) and nicotine is a vasoconstrictor (essentially, shrinks blood vessels).  Therefore, if vasoconstriction occurs to these microscopic blood vessels they essentially disappear and are unable to deliver necessary nutrients to the healing tissue.  This is one modifiable factor that you can do to dramatically increase your chances of healing your injury.    (This means no smoking, no nicotine gum, patches, etc)  DO NOT USE NONSTEROIDAL ANTI-INFLAMMATORY DRUGS (NSAID'S)  Using products such as Advil (ibuprofen), Aleve (naproxen), Motrin (ibuprofen) for additional pain control during fracture healing can delay and/or prevent the healing response.  If you would like to take over the counter (OTC) medication, Tylenol (acetaminophen) is ok.  However, some narcotic medications that are given for pain control contain acetaminophen as well. Therefore, you should not exceed more than 4000 mg of tylenol in a day if you do not have liver disease.  Also note that there are may OTC medicines, such as cold medicines and allergy medicines that my contain tylenol as well.  If you have any questions about medications and/or interactions please ask your doctor/PA or your pharmacist.      ICE AND ELEVATE INJURED/OPERATIVE EXTREMITY  Using ice and elevating the injured extremity above your heart can help with swelling and pain control.  Icing in a pulsatile fashion, such as 20 minutes on and 20 minutes off, can be followed.    Do not place ice directly on skin. Make sure there is a barrier between to skin and the ice pack.    Using frozen items such as frozen peas works well as the conform nicely to the are that needs to be  iced.  USE AN ACE WRAP OR TED HOSE FOR SWELLING CONTROL  In addition to icing and elevation, Ace wraps or TED hose are used to help limit and resolve swelling.  It is recommended to use Ace wraps or TED hose until you are informed to stop.    When using Ace Wraps start the wrapping distally  (farthest away from the body) and wrap proximally (closer to the body)   Example: If you had surgery on your leg or thing and you do not have a splint on, start the ace wrap at the toes and work your way up to the thigh        If you had surgery on your upper extremity and do not have a splint on, start the ace wrap at your fingers and work your way up to the upper arm  CALL THE OFFICE WITH ANY QUESTIONS OR CONCERNS: [phone]    Discharge Wound Care Instructions  Do NOT apply any ointments, solutions or lotions to pin sites or surgical wounds.  These prevent needed drainage and even though solutions like hydrogen peroxide kill bacteria, they also damage cells lining the pin sites that help fight infection.  Applying lotions or ointments can keep the wounds moist and can cause them to breakdown and open up as well. This can increase the risk for infection. When in doubt call the office.

## 2017-10-16 ENCOUNTER — Encounter (HOSPITAL_COMMUNITY): Payer: Self-pay | Admitting: Student

## 2019-03-19 ENCOUNTER — Encounter (HOSPITAL_COMMUNITY): Payer: Self-pay

## 2019-03-19 ENCOUNTER — Other Ambulatory Visit: Payer: Self-pay

## 2019-03-19 ENCOUNTER — Emergency Department (HOSPITAL_COMMUNITY)
Admission: EM | Admit: 2019-03-19 | Discharge: 2019-03-19 | Disposition: A | Discharge status: Home / Self Care | Payer: Self-pay | Attending: Emergency Medicine | Admitting: Emergency Medicine

## 2019-03-19 ENCOUNTER — Emergency Department (HOSPITAL_COMMUNITY): Payer: Self-pay

## 2019-03-19 DIAGNOSIS — Y929 Unspecified place or not applicable: Secondary | ICD-10-CM | POA: Insufficient documentation

## 2019-03-19 DIAGNOSIS — S81812A Laceration without foreign body, left lower leg, initial encounter: Secondary | ICD-10-CM | POA: Insufficient documentation

## 2019-03-19 DIAGNOSIS — Z79899 Other long term (current) drug therapy: Secondary | ICD-10-CM | POA: Insufficient documentation

## 2019-03-19 DIAGNOSIS — Y939 Activity, unspecified: Secondary | ICD-10-CM | POA: Insufficient documentation

## 2019-03-19 DIAGNOSIS — Y999 Unspecified external cause status: Secondary | ICD-10-CM | POA: Insufficient documentation

## 2019-03-19 DIAGNOSIS — W293XXA Contact with powered garden and outdoor hand tools and machinery, initial encounter: Secondary | ICD-10-CM | POA: Insufficient documentation

## 2019-03-19 MED ORDER — LIDOCAINE HCL 2 % IJ SOLN
20.0000 mL | Freq: Once | INTRAMUSCULAR | Status: DC
  Filled 2019-03-19: qty 20

## 2019-03-19 NOTE — ED Triage Notes (Signed)
Patient here with leg laceration to left medial knee with gas clippers. Wrapped at [facility] due to heavier bleeding and not unwrapped at triage. Bleeding controlled and patient complains of minimal pain

## 2019-03-19 NOTE — Discharge Instructions (Addendum)
Please read attached information. If you experience any new or worsening signs or symptoms please return to the emergency room for evaluation. Please follow-up with your primary care provider or specialist as discussed.  °

## 2019-03-19 NOTE — ED Provider Notes (Signed)
[facility] EMERGENCY DEPARTMENT Provider Note   CSN: [id number] Arrival date & time: 03/19/19  1153     History   Chief Complaint No chief complaint on file.   HPI Shawn Thornton is a 59 y.o. male.     HPI   58 year old male presents today with laceration to his left knee.  Patient notes he was using a tree trimmer that clipped his medial knee.  He notes this was bleeding.  He was seen in urgent care referred in the emergency room.  He notes the bleeding controlled with direct pressure.  He notes a small wound.  No significant pain at the knee.  His tetanus is up-to-date as it was given last year.        Past Medical History:  Diagnosis Date  . Medical history non-contributory     Patient Active Problem List   Diagnosis Date Noted  . Patellar tendon rupture, right, initial encounter 10/09/2017    Past Surgical History:  Procedure Laterality Date  . LEG SURGERY Right    traumatic injury with sutures  . PATELLAR TENDON REPAIR Right 10/12/2017   Procedure: PATELLA TENDON REPAIR;  Surgeon: Roby Lofts, MD;  Location: [facility] OR;  Service: Orthopedics;  Laterality: Right;        Home Medications    Prior to Admission medications   Medication Sig Start Date End Date Taking? Authorizing Provider  HYDROcodone-acetaminophen (NORCO) 5-325 MG tablet Take 1 tablet by mouth every 6 (six) hours as needed for moderate pain. 10/12/17   Haddix, Gillie Manners, MD  Multiple Vitamins-Minerals (MULTIVITAMIN WITH MINERALS) tablet Take 1 tablet by mouth daily.    [provider]    Family History Family History  Problem Relation Age of Onset  . Anuerysm Mother     Social History Social History   Tobacco Use  . Smoking status: Never Smoker  . Smokeless tobacco: Never Used  Substance Use Topics  . Alcohol use: Never    Frequency: Never  . Drug use: Never     Allergies   Penicillins   Review of Systems Review of Systems  All other systems  reviewed and are negative.    Physical Exam Updated Vital Signs BP (!) 148/84   Pulse 78   Temp 98.1 F (36.7 C) (Oral)   Resp 18   SpO2 96%   Physical Exam Vitals signs and nursing note reviewed.  Constitutional:      Appearance: He is well-developed.  HENT:     Head: Normocephalic and atraumatic.  Eyes:     General: No scleral icterus.       Right eye: No discharge.        Left eye: No discharge.     Conjunctiva/sclera: Conjunctivae normal.     Pupils: Pupils are equal, round, and reactive to light.  Neck:     Musculoskeletal: Normal range of motion.     Vascular: No JVD.     Trachea: No tracheal deviation.  Pulmonary:     Effort: Pulmonary effort is normal.     Breath sounds: No stridor.  Musculoskeletal:     Comments: 1 cm laceration to the left medial knee, full-thickness, no joint involvement full active range of motion the knee no significant bleed noted no foreign bodies  Neurological:     Mental Status: He is alert and oriented to person, place, and time.     Coordination: Coordination normal.  Psychiatric:  Behavior: Behavior normal.        Thought Content: Thought content normal.        Judgment: Judgment normal.      ED Treatments / Results  Labs (all labs ordered are listed, but only abnormal results are displayed) Labs Reviewed - No data to display  EKG None  Radiology Dg Knee Complete 4 Views Left  Result Date: 03/19/2019 CLINICAL DATA:  Laceration to the medial knee. EXAM: LEFT KNEE - COMPLETE 4+ VIEW COMPARISON:  None. FINDINGS: No fracture. No subluxation or dislocation. Trace spurring noted medial compartment and patellofemoral compartment. No joint effusion. No evidence for radiopaque soft tissue foreign body. IMPRESSION: Negative. Electronically Signed   By: Misty Stanley M.D.   On: 03/19/2019 14:29    Procedures .Marland KitchenLaceration Repair  Date/Time: 03/19/2019 6:50 PM Performed by: Okey Regal, PA-C Authorized by: Okey Regal, PA-C   Consent:    Consent obtained:  Verbal   Consent given by:  Patient   Risks discussed:  Pain   Alternatives discussed:  No treatment and observation Anesthesia (see MAR for exact dosages):    Anesthesia method:  Local infiltration   Local anesthetic:  Lidocaine 2% w/o epi Laceration details:    Location: left knee.   Length (cm):  1 Repair type:    Repair type:  Simple Pre-procedure details:    Preparation:  Patient was prepped and draped in usual sterile fashion Exploration:    Hemostasis achieved with:  Direct pressure   Wound exploration: wound explored through full range of motion and entire depth of wound probed and visualized     Wound extent: no fascia violation noted, no foreign bodies/material noted, no muscle damage noted, no nerve damage noted, no tendon damage noted, no underlying fracture noted and no vascular damage noted     Contaminated: no   Treatment:    Area cleansed with:  Saline   Amount of cleaning:  Standard   Irrigation solution:  Sterile saline Skin repair:    Repair method:  Sutures   Suture size:  2-0   Suture material:  Fast-absorbing gut   Suture technique:  Simple interrupted   Number of sutures:  2 Approximation:    Approximation:  Close Post-procedure details:    Dressing:  Antibiotic ointment   Patient tolerance of procedure:  Tolerated well, no immediate complications   (including critical care time)  Repair completed with assistance from PA Student Hunter   Medications Ordered in ED Medications - No data to display   Initial Impression / Assessment and Plan / ED Course  I have reviewed the triage vital signs and the nursing notes.  Pertinent labs & imaging results that were available during my care of the patient were reviewed by me and considered in my medical decision making (see chart for details).         Final Clinical Impressions(s) / ED Diagnoses   Final diagnoses:  Laceration of left lower extremity,  initial encounter   58 year old male with laceration.  This is uncomplicated this does not go into his joint, no major vascular etiology, repair without complication discharged with strict return precautions.  Verbalized understanding and agreement to today's plan.   ED Discharge Orders    None       Francee Gentile 03/19/19 Yevette Edwards    Gareth Morgan, MD 03/19/19 2118

## 2019-03-19 NOTE — ED Notes (Signed)
Bleeding through gauze wrap applied in triage. Abd pad applied.

## 2019-04-27 IMAGING — CR DG KNEE COMPLETE 4+V*R*
4 series · 4 of 4 positions shown · non-contrast
Comparison: None.

CLINICAL DATA: Fall with severe pain and swelling

EXAM:
RIGHT KNEE - COMPLETE 4+ VIEW

[t knee ap right]
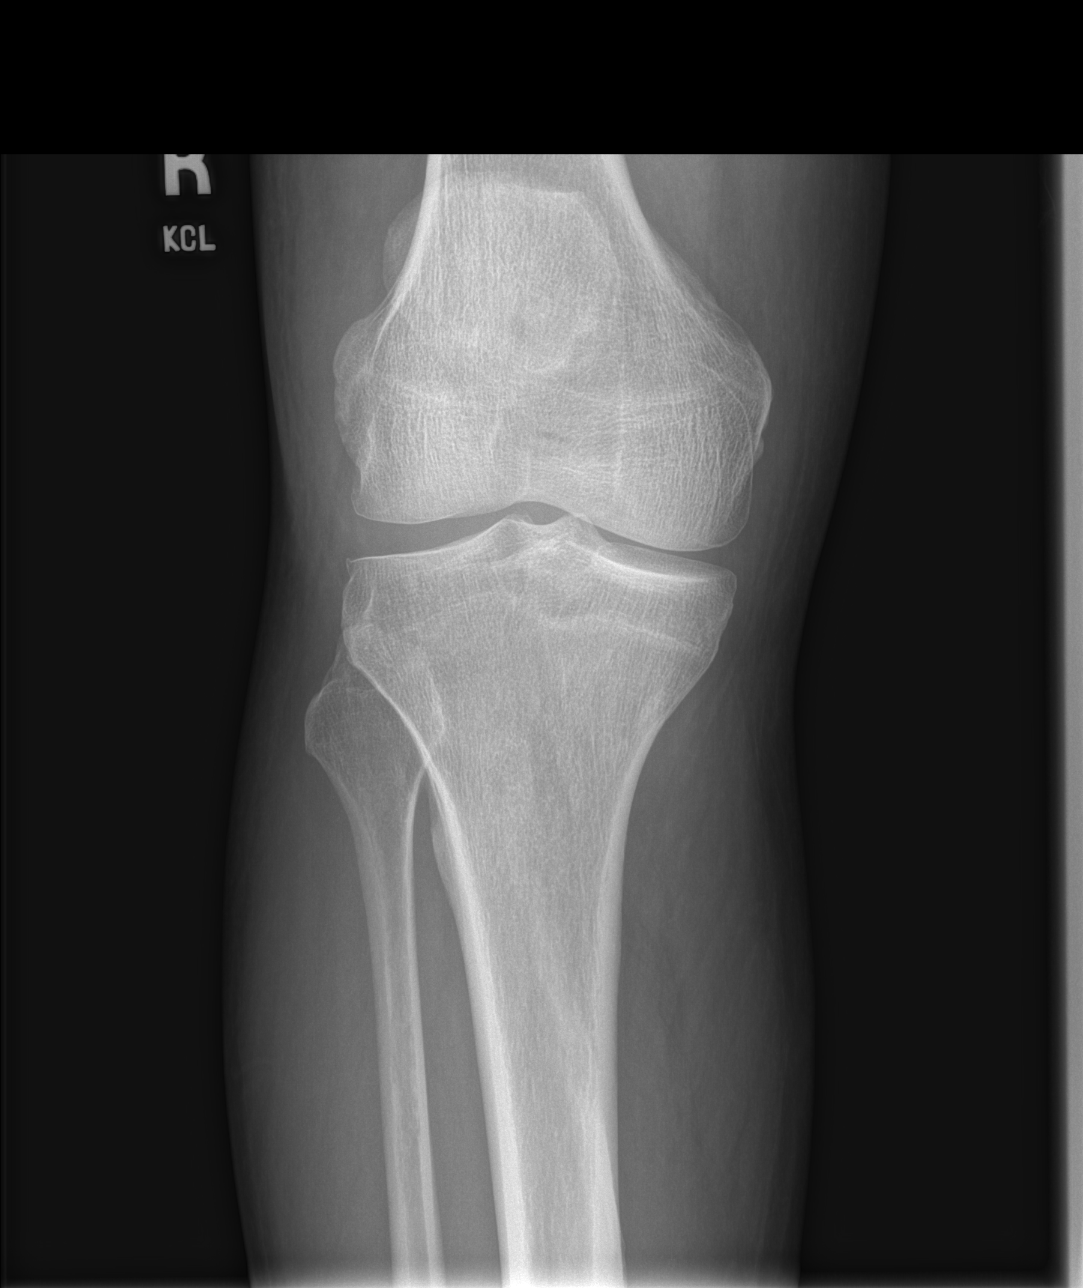

[t knee obl right (1 of 2)]
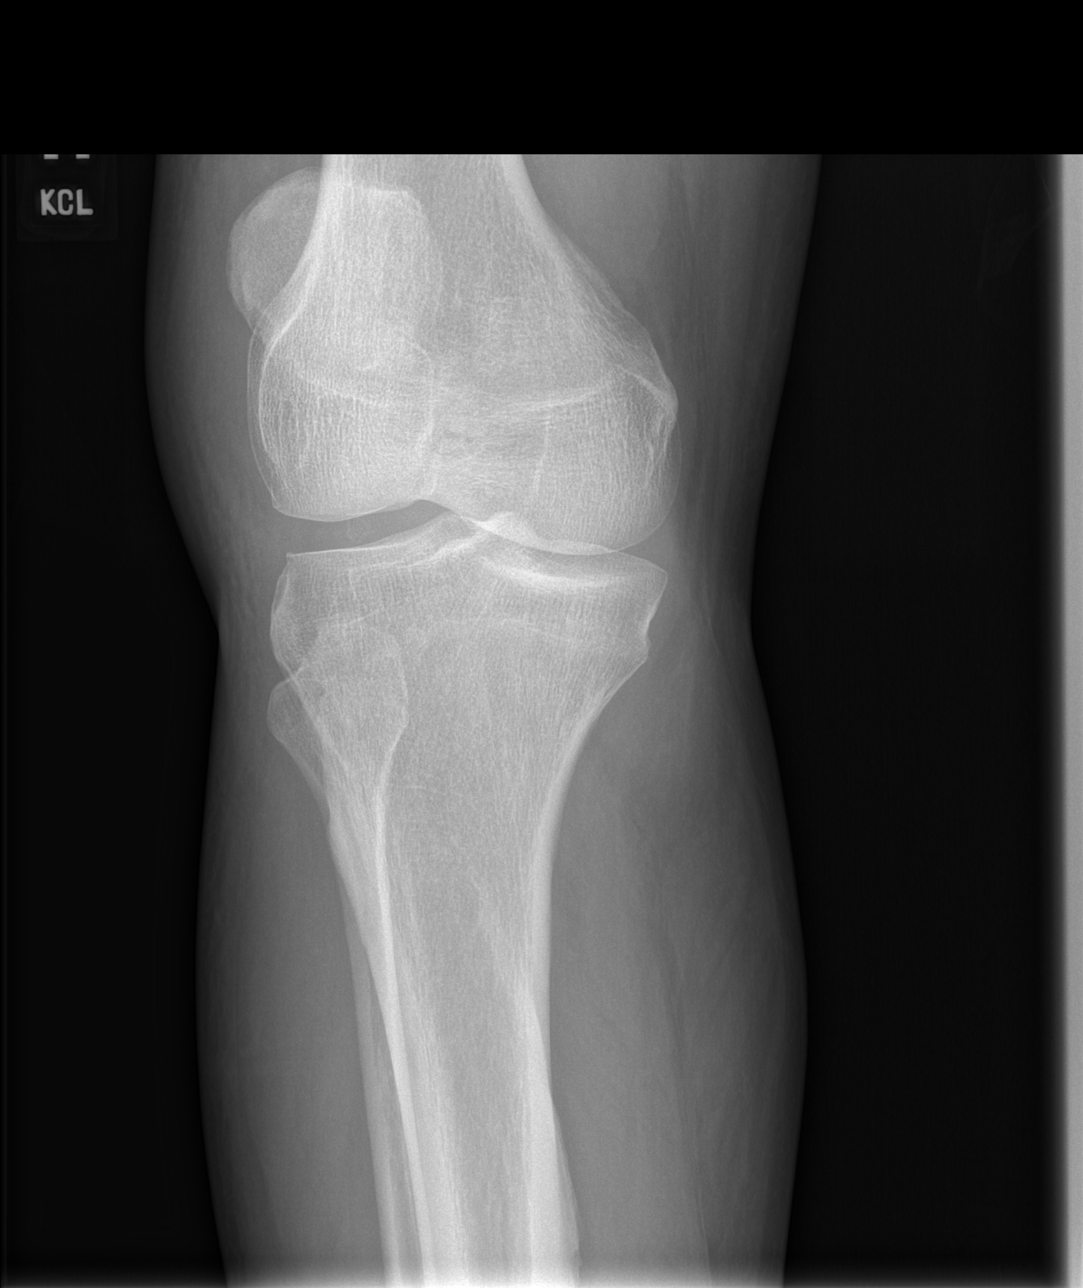

[t knee obl right (2 of 2)]
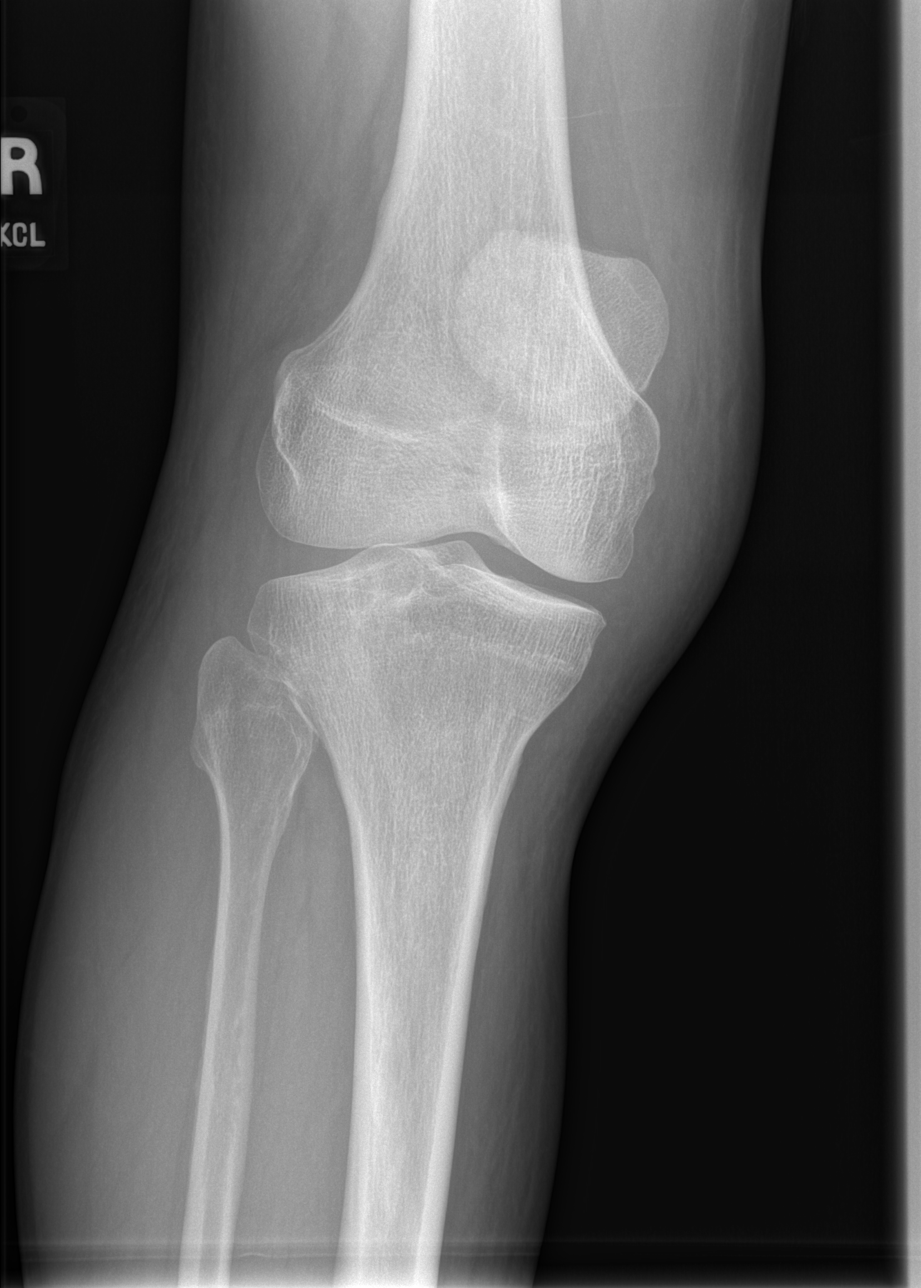

[t knee lat right]
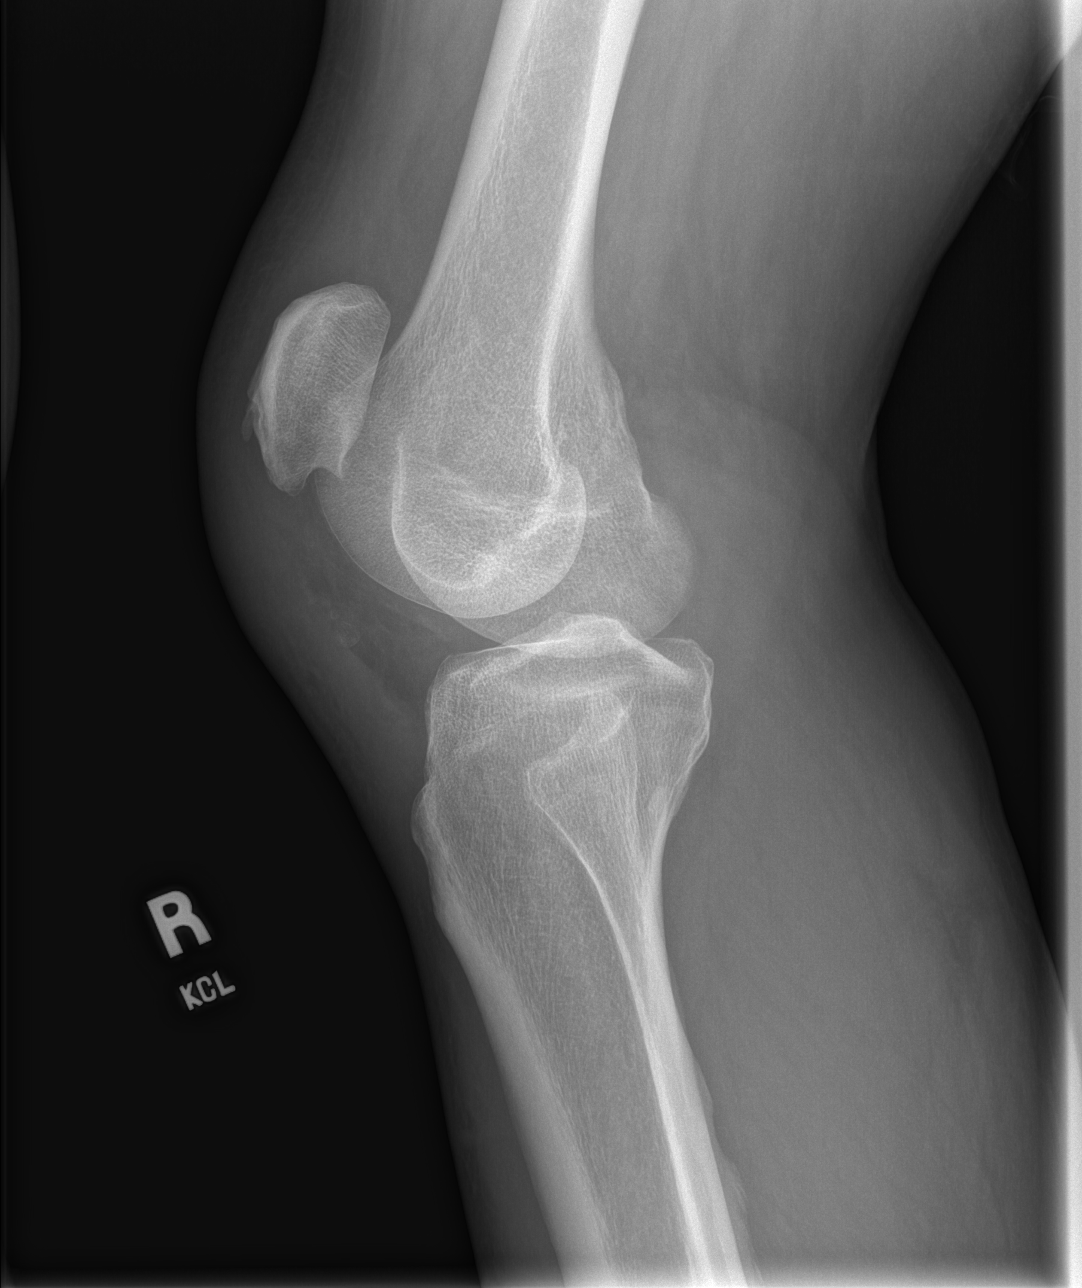

[4 of 4 positions shown; findings below may reference images not displayed]

FINDINGS: No acute displaced fracture is seen. Mild degenerative changes of
the medial compartment. Patella Alta with significant soft tissue
swelling of the infrapatellar soft tissues.
IMPRESSION: 1. No acute fracture seen
2. Patella Alta with significant soft tissue thickening in the
region of the infrapatellar soft tissues; if patellar tendon rupture
or injury is suspected clinically, further evaluation with MRI could
be obtained.
